# Patient Record
Sex: Female | Born: 1950 | Race: White | Hispanic: No | Smoking: Former smoker
Health system: Southern US, Community
[De-identification: ages and names within clinical notes are randomized; demographics above are authoritative.]

## PROBLEM LIST (undated history)

## (undated) DIAGNOSIS — T8859XA Other complications of anesthesia, initial encounter: Secondary | ICD-10-CM

## (undated) DIAGNOSIS — G709 Myoneural disorder, unspecified: Secondary | ICD-10-CM

## (undated) DIAGNOSIS — B029 Zoster without complications: Secondary | ICD-10-CM

## (undated) DIAGNOSIS — S0993XA Unspecified injury of face, initial encounter: Secondary | ICD-10-CM

## (undated) DIAGNOSIS — M722 Plantar fascial fibromatosis: Secondary | ICD-10-CM

## (undated) DIAGNOSIS — Z8781 Personal history of (healed) traumatic fracture: Secondary | ICD-10-CM

## (undated) DIAGNOSIS — T4145XA Adverse effect of unspecified anesthetic, initial encounter: Secondary | ICD-10-CM

## (undated) DIAGNOSIS — M1990 Unspecified osteoarthritis, unspecified site: Secondary | ICD-10-CM

## (undated) HISTORY — DX: Unspecified injury of face, initial encounter: S09.93XA

## (undated) HISTORY — DX: Personal history of (healed) traumatic fracture: Z87.81

## (undated) HISTORY — PX: FOOT SURGERY: SHX648

## (undated) HISTORY — DX: Plantar fascial fibromatosis: M72.2

## (undated) HISTORY — DX: Zoster without complications: B02.9

## (undated) HISTORY — PX: PERINEOPLASTY: SHX2218

---

## 1998-07-20 ENCOUNTER — Encounter: Admission: RE | Admit: 1998-07-20 | Discharge: 1998-07-20 | Payer: Self-pay | Admitting: Sports Medicine

## 2009-01-06 ENCOUNTER — Ambulatory Visit: Payer: Self-pay | Admitting: Sports Medicine

## 2009-01-06 DIAGNOSIS — M2140 Flat foot [pes planus] (acquired), unspecified foot: Secondary | ICD-10-CM | POA: Insufficient documentation

## 2009-01-06 DIAGNOSIS — M25569 Pain in unspecified knee: Secondary | ICD-10-CM

## 2009-02-09 ENCOUNTER — Ambulatory Visit: Payer: Self-pay | Admitting: Sports Medicine

## 2009-03-26 ENCOUNTER — Ambulatory Visit: Payer: Self-pay | Admitting: Sports Medicine

## 2009-03-26 DIAGNOSIS — M84376A Stress fracture, unspecified foot, initial encounter for fracture: Secondary | ICD-10-CM | POA: Insufficient documentation

## 2009-04-24 ENCOUNTER — Ambulatory Visit: Payer: Self-pay | Admitting: Family Medicine

## 2009-05-04 ENCOUNTER — Ambulatory Visit: Payer: Self-pay | Admitting: Family Medicine

## 2009-05-18 ENCOUNTER — Ambulatory Visit: Payer: Self-pay | Admitting: Family Medicine

## 2009-06-08 ENCOUNTER — Ambulatory Visit: Payer: Self-pay | Admitting: Family Medicine

## 2009-06-08 DIAGNOSIS — G5760 Lesion of plantar nerve, unspecified lower limb: Secondary | ICD-10-CM | POA: Insufficient documentation

## 2009-06-09 ENCOUNTER — Telehealth (INDEPENDENT_AMBULATORY_CARE_PROVIDER_SITE_OTHER): Payer: Self-pay | Admitting: *Deleted

## 2009-06-22 ENCOUNTER — Encounter: Payer: Self-pay | Admitting: Family Medicine

## 2010-06-11 ENCOUNTER — Ambulatory Visit: Payer: Self-pay | Admitting: Psychology

## 2010-06-30 ENCOUNTER — Ambulatory Visit
Admission: RE | Admit: 2010-06-30 | Discharge: 2010-06-30 | Payer: Self-pay | Source: Home / Self Care | Attending: Psychology | Admitting: Psychology

## 2010-07-14 ENCOUNTER — Ambulatory Visit
Admission: RE | Admit: 2010-07-14 | Discharge: 2010-07-14 | Payer: Self-pay | Source: Home / Self Care | Attending: Psychology | Admitting: Psychology

## 2012-01-08 ENCOUNTER — Ambulatory Visit (INDEPENDENT_AMBULATORY_CARE_PROVIDER_SITE_OTHER): Payer: BC Managed Care – PPO | Admitting: Emergency Medicine

## 2012-01-08 VITALS — BP 122/77 | HR 76 | Temp 97.6°F | Resp 16 | Ht 65.0 in | Wt 141.0 lb

## 2012-01-08 DIAGNOSIS — B029 Zoster without complications: Secondary | ICD-10-CM

## 2012-01-08 MED ORDER — VALACYCLOVIR HCL 1 G PO TABS: ORAL_TABLET | ORAL | Status: DC

## 2012-01-08 MED ORDER — GABAPENTIN 300 MG PO CAPS: ORAL_CAPSULE | ORAL | Status: DC

## 2012-01-08 NOTE — Patient Instructions (Signed)

## 2012-01-08 NOTE — Progress Notes (Signed)
  Subjective:    Patient ID: Felicia Peters, female    DOB: [birth year], 61 y.o.   MRN: [id number]  HPI patient here with 2 week history of intermittent pain in her right flank which radiates around to the right breast. Yesterday she noted a rash which developed on her back and now is underneath her right breast the    Review of Systems     Objective:   Physical Exam there is a typical shingles eruption in the distribution of T7 on the right. There is skin sensitivity in this area to        Assessment & Plan:  Patient with acute shingles. We'll treat with Valtrex 1 g 3 times a day Neurontin at night recheck in 3-4 days if pain is persistent.

## 2014-01-13 ENCOUNTER — Telehealth: Payer: Self-pay

## 2014-01-13 NOTE — Telephone Encounter (Signed)
Pt called and would like to have direct colon with Dr. Marina GoodellPerry. OK to schedule per Dr. Marina GoodellPerry. Screening colon.

## 2014-01-14 NOTE — Telephone Encounter (Signed)
Left message for Ms Felicia Peters to call me back & schedule Colonoscopy/yesi

## 2014-01-16 NOTE — Telephone Encounter (Signed)
Spoke with pt and she is checking on a ride for colon. Placed 02/10/14@3pm  on hold and she is going to call back and let me know if this will work for her.

## 2014-01-16 NOTE — Telephone Encounter (Signed)
Pt called back and would like to have colon done 01/27/14@3 :30pm. Previsit scheduled for 01/20/14@11am . Pt aware of appts.

## 2014-01-20 ENCOUNTER — Ambulatory Visit (AMBULATORY_SURGERY_CENTER): Payer: BC Managed Care – PPO | Admitting: *Deleted

## 2014-01-20 VITALS — Ht 66.0 in | Wt 136.2 lb

## 2014-01-20 DIAGNOSIS — Z1211 Encounter for screening for malignant neoplasm of colon: Secondary | ICD-10-CM

## 2014-01-20 MED ORDER — MOVIPREP 100 G PO SOLR: 1.0000 | Freq: Once | ORAL | Status: DC

## 2014-01-20 NOTE — Progress Notes (Signed)
Denies allergies to eggs or soy products. Denies complications with sedation or anesthesia. Denies O2 use. Denies use of diet or weight loss medications.  Emmi instructions given for colonoscopy.  

## 2014-01-27 ENCOUNTER — Ambulatory Visit (AMBULATORY_SURGERY_CENTER): Payer: BC Managed Care – PPO | Admitting: Internal Medicine

## 2014-01-27 ENCOUNTER — Encounter: Payer: Self-pay | Admitting: Internal Medicine

## 2014-01-27 VITALS — BP 106/67 | HR 66 | Temp 98.1°F | Resp 18 | Ht 66.0 in | Wt 136.0 lb

## 2014-01-27 DIAGNOSIS — Z1211 Encounter for screening for malignant neoplasm of colon: Secondary | ICD-10-CM

## 2014-01-27 MED ORDER — SODIUM CHLORIDE 0.9 % IV SOLN: 500.0000 mL | INTRAVENOUS | Status: DC

## 2014-01-27 NOTE — Op Note (Signed)
[facility] [address] [place], [id number]   COLONOSCOPY PROCEDURE REPORT  PATIENT: Felicia Peters, Felicia T.  MR#: [id number] BIRTHDATE: [birth year] , 63  yrs. old GENDER: Female ENDOSCOPIST: Roxy CedarJohn N Perry Jr, MD REFERRED BY:.Direct Self PROCEDURE DATE:  01/27/2014 PROCEDURE:   Colonoscopy, screening First Screening Colonoscopy - Avg.  risk and is 50 yrs.  old or older Yes.  Prior Negative Screening - Now for repeat screening. N/A  History of Adenoma - Now for follow-up colonoscopy & has been > or = to 3 yrs.  N/A  Polyps Removed Today? No.  Recommend repeat exam, <10 yrs? No. ASA CLASS:   Class I INDICATIONS:average risk screening. MEDICATIONS: MAC sedation, administered by CRNA and propofol (Diprivan) 400mg  IV  DESCRIPTION OF PROCEDURE:   After the risks benefits and alternatives of the procedure were thoroughly explained, informed consent was obtained.  A digital rectal exam revealed no abnormalities of the rectum.   The LB [id number]  endoscope was introduced through the anus and advanced to the cecum, which was identified by both the appendix and ileocecal valve. No adverse events experienced.   The quality of the prep was good, using MoviPrep  The instrument was then slowly withdrawn as the colon was fully examined.   COLON FINDINGS: A normal appearing cecum, ileocecal valve, and appendiceal orifice were identified.  The ascending, hepatic flexure, transverse, splenic flexure, descending, sigmoid colon and rectum appeared unremarkable.  No polyps or cancers were seen. Retroflexed views revealed internal hemorrhoids. The time to cecum=4 minutes 07 seconds.  Withdrawal time=12 minutes 40 seconds. The scope was withdrawn and the procedure completed.  COMPLICATIONS: There were no complications.  ENDOSCOPIC IMPRESSION: 1.Normal colon  RECOMMENDATIONS: 1.Continue current colorectal screening recommendations for "routine risk" patients with a repeat  colonoscopy in 10 years.   eSigned:  Roxy CedarJohn N Perry Jr, MD 01/27/2014 4:01 PM   cc: The Patient and Lesle ChrisSteven Daub, MD

## 2014-01-27 NOTE — Patient Instructions (Signed)
YOU HAD AN ENDOSCOPIC PROCEDURE TODAY AT THE [facility]: Refer to the procedure report that was given to you for any specific questions about what was found during the examination.  If the procedure report does not answer your questions, please call your gastroenterologist to clarify.  If you requested that your care partner not be given the details of your procedure findings, then the procedure report has been included in a sealed envelope for you to review at your convenience later.  YOU SHOULD EXPECT: Some feelings of bloating in the abdomen. Passage of more gas than usual.  Walking can help get rid of the air that was put into your GI tract during the procedure and reduce the bloating. If you had a lower endoscopy (such as a colonoscopy or flexible sigmoidoscopy) you may notice spotting of blood in your stool or on the toilet paper. If you underwent a bowel prep for your procedure, then you may not have a normal bowel movement for a few days.  DIET: Your first meal following the procedure should be a light meal and then it is ok to progress to your normal diet.  A half-sandwich or bowl of soup is an example of a good first meal.  Heavy or fried foods are harder to digest and may make you feel nauseous or bloated.  Likewise meals heavy in dairy and vegetables can cause extra gas to form and this can also increase the bloating.  Drink plenty of fluids but you should avoid alcoholic beverages for 24 hours.  ACTIVITY: Your care partner should take you home directly after the procedure.  You should plan to take it easy, moving slowly for the rest of the day.  You can resume normal activity the day after the procedure however you should NOT DRIVE or use heavy machinery for 24 hours (because of the sedation medicines used during the test).    SYMPTOMS TO REPORT IMMEDIATELY: A gastroenterologist can be reached at any hour.  During normal business hours, 8:30 AM to 5:00 PM Monday through Friday,  call [phone].  After hours and on weekends, please call the GI answering service at [phone] who will take a message and have the physician on call contact you.   Following lower endoscopy (colonoscopy or flexible sigmoidoscopy):  Excessive amounts of blood in the stool  Significant tenderness or worsening of abdominal pains  Swelling of the abdomen that is new, acute  Fever of 100F or higher    FOLLOW UP: If any biopsies were taken you will be contacted by phone or by letter within the next 1-3 weeks.  Call your gastroenterologist if you have not heard about the biopsies in 3 weeks.  Our staff will call the home number listed on your records the next business day following your procedure to check on you and address any questions or concerns that you may have at that time regarding the information given to you following your procedure. This is a courtesy call and so if there is no answer at the home number and we have not heard from you through the emergency physician on call, we will assume that you have returned to your regular daily activities without incident.  SIGNATURES/CONFIDENTIALITY: You and/or your care partner have signed paperwork which will be entered into your electronic medical record.  These signatures attest to the fact that that the information above on your After Visit Summary has been reviewed and is understood.  Full responsibility of the confidentiality   of this discharge information lies with you and/or your care-partner.  Normal colonoscopy-Repeat in 10 years-2025. 

## 2014-01-27 NOTE — Progress Notes (Signed)
A/ox3, pleased with MAC, report to RN 

## 2014-01-28 ENCOUNTER — Telehealth: Payer: Self-pay | Admitting: *Deleted

## 2014-01-28 NOTE — Telephone Encounter (Signed)
  Follow up Call-  Call back number 01/27/2014  Post procedure Call Back phone  # [phone]  Permission to leave phone message Yes     Patient questions:  Do you have a fever, pain , or abdominal swelling? No. Pain Score  0 *  Have you tolerated food without any problems? Yes.    Have you been able to return to your normal activities? Yes.    Do you have any questions about your discharge instructions: Diet   No. Medications  No. Follow up visit  No.  Do you have questions or concerns about your Care? No.  Actions: * If pain score is 4 or above: No action needed, pain <4.

## 2015-03-31 ENCOUNTER — Encounter: Payer: Self-pay | Admitting: Emergency Medicine

## 2016-07-11 ENCOUNTER — Other Ambulatory Visit: Payer: Self-pay | Admitting: Sports Medicine

## 2016-07-11 DIAGNOSIS — M545 Low back pain: Secondary | ICD-10-CM

## 2016-07-16 ENCOUNTER — Ambulatory Visit
Admission: RE | Admit: 2016-07-16 | Discharge: 2016-07-16 | Disposition: A | Discharge status: Home / Self Care | Payer: BC Managed Care – PPO | Source: Ambulatory Visit | Attending: Sports Medicine | Admitting: Sports Medicine

## 2016-07-16 DIAGNOSIS — M545 Low back pain: Secondary | ICD-10-CM

## 2016-07-19 ENCOUNTER — Other Ambulatory Visit: Payer: Self-pay | Admitting: Sports Medicine

## 2016-07-19 DIAGNOSIS — M129 Arthropathy, unspecified: Secondary | ICD-10-CM

## 2016-07-22 ENCOUNTER — Ambulatory Visit
Admission: RE | Admit: 2016-07-22 | Discharge: 2016-07-22 | Disposition: A | Discharge status: Home / Self Care | Payer: BC Managed Care – PPO | Source: Ambulatory Visit | Attending: Sports Medicine | Admitting: Sports Medicine

## 2016-07-22 ENCOUNTER — Other Ambulatory Visit: Payer: Self-pay | Admitting: Sports Medicine

## 2016-07-22 DIAGNOSIS — M129 Arthropathy, unspecified: Secondary | ICD-10-CM

## 2016-07-22 DIAGNOSIS — M47816 Spondylosis without myelopathy or radiculopathy, lumbar region: Secondary | ICD-10-CM

## 2016-07-22 MED ORDER — METHYLPREDNISOLONE ACETATE 40 MG/ML INJ SUSP (RADIOLOG
120.0000 mg | Freq: Once | INTRAMUSCULAR | Status: AC
  Administered 2016-07-22: 10 mg via EPIDURAL

## 2016-07-22 MED ORDER — IOPAMIDOL (ISOVUE-M 200) INJECTION 41%
1.0000 mL | Freq: Once | INTRAMUSCULAR | Status: AC
  Administered 2016-07-22: 1 mL via EPIDURAL

## 2016-07-22 NOTE — Discharge Instructions (Signed)

## 2016-07-28 ENCOUNTER — Inpatient Hospital Stay: Admission: RE | Admit: 2016-07-28 | Payer: BC Managed Care – PPO | Source: Ambulatory Visit

## 2016-08-31 ENCOUNTER — Other Ambulatory Visit: Payer: Self-pay | Admitting: Neurosurgery

## 2016-12-01 ENCOUNTER — Encounter (HOSPITAL_COMMUNITY): Payer: Self-pay

## 2016-12-01 ENCOUNTER — Encounter (HOSPITAL_COMMUNITY)
Admission: RE | Admit: 2016-12-01 | Discharge: 2016-12-01 | Disposition: A | Discharge status: Home / Self Care | Payer: BC Managed Care – PPO | Source: Ambulatory Visit | Attending: Neurosurgery | Admitting: Neurosurgery

## 2016-12-01 DIAGNOSIS — Z01812 Encounter for preprocedural laboratory examination: Secondary | ICD-10-CM | POA: Diagnosis not present

## 2016-12-01 DIAGNOSIS — M48062 Spinal stenosis, lumbar region with neurogenic claudication: Secondary | ICD-10-CM | POA: Diagnosis not present

## 2016-12-01 HISTORY — DX: Other complications of anesthesia, initial encounter: T88.59XA

## 2016-12-01 HISTORY — DX: Adverse effect of unspecified anesthetic, initial encounter: T41.45XA

## 2016-12-01 HISTORY — DX: Myoneural disorder, unspecified: G70.9

## 2016-12-01 HISTORY — DX: Unspecified osteoarthritis, unspecified site: M19.90

## 2016-12-01 LAB — BASIC METABOLIC PANEL
Anion gap: 8 (ref 5–15)
BUN: 15 mg/dL (ref 6–20)
CO2: 25 mmol/L (ref 22–32)
Calcium: 9.2 mg/dL (ref 8.9–10.3)
Chloride: 105 mmol/L (ref 101–111)
Creatinine, Ser: 0.78 mg/dL (ref 0.44–1.00)
GFR calc Af Amer: >60 mL/min (ref >60)
GFR calc non Af Amer: >60 mL/min (ref >60)
Glucose, Bld: 96 mg/dL (ref 65–99)
Potassium: 4.3 mmol/L (ref 3.5–5.1)
Sodium: 138 mmol/L (ref 135–145)

## 2016-12-01 LAB — TYPE AND SCREEN
ABO/RH(D): O POS
Antibody Screen: NEGATIVE

## 2016-12-01 LAB — SURGICAL PCR SCREEN
MRSA, PCR: NEGATIVE
Staphylococcus aureus: NEGATIVE

## 2016-12-01 LAB — CBC
HCT: 43.2 % (ref 36.0–46.0)
Hemoglobin: 14.1 g/dL (ref 12.0–15.0)
MCH: 30.7 pg (ref 26.0–34.0)
MCHC: 32.6 g/dL (ref 30.0–36.0)
MCV: 93.9 fL (ref 78.0–100.0)
Platelets: 252 10*3/uL (ref 150–400)
RBC: 4.6 MIL/uL (ref 3.87–5.11)
RDW: 13 % (ref 11.5–15.5)
WBC: 6.5 10*3/uL (ref 4.0–10.5)

## 2016-12-01 LAB — ABO/RH: ABO/RH(D): O POS

## 2016-12-01 NOTE — Progress Notes (Signed)
Pt. Denies any cardiac issues, denies problems in the past or currently. Pt. Denies ever having any cardiac testing.  Pt. Sees GYN - Dr. Rana SnareLowe every yr. But for a PCP- uses Urgent care & has mostly seen Dr. Kathie RhodesS. Cleta Albertsaub.

## 2016-12-01 NOTE — Pre-Procedure Instructions (Signed)
Freddrick Marcholly T Cicero  12/01/2016      [facility] [place] - [address] [place] [id number] Phone: [phone] Fax: [phone]    Your procedure is scheduled on 12/07/2016  [facility] Admitting at 6:30 A.M.  Call this number if you have problems the morning of surgery:  [phone]   Stop all supplements now- vitamins, ibuprofen, ADVIL   Remember:  Do not eat food or drink liquids after midnight. ON Tuesday night    Take these medicines the morning of surgery with A SIP OF WATER : with hormone pill & tylenol or Tramadol if needed    Do not wear jewelry, make-up or nail polish.   Do not wear lotions, powders, or perfumes, or deoderant.   Do not shave 48 hours prior to surgery.     Do not bring valuables to the hospital.   [facility] is not responsible for any belongings or valuables.  Contacts, dentures or bridgework may not be worn into surgery.  Leave your suitcase in the car.  After surgery it may be brought to your room.  For patients admitted to the hospital, discharge time will be determined by your treatment team.  Patients discharged the day of surgery will not be allowed to drive home.   Name and phone number of your driver:   With family  Special instructions:  Special Instructions: [place] - Preparing for Surgery  Before surgery, you can play an important role.  Because skin is not sterile, your skin needs to be as free of germs as possible.  You can reduce the number of germs on you skin by washing with CHG (chlorahexidine gluconate) soap before surgery.  CHG is an antiseptic cleaner which kills germs and bonds with the skin to continue killing germs even after washing.  Please DO NOT use if you have an allergy to CHG or antibacterial soaps.  If your skin becomes reddened/irritated stop using the CHG and inform your nurse when you arrive at Short Stay.  Do not shave (including legs and underarms) for  at least 48 hours prior to the first CHG shower.  You may shave your face.  Please follow these instructions carefully:   1.  Shower with CHG Soap the night before surgery and the  morning of Surgery.  2.  If you choose to wash your hair, wash your hair first as usual with your  normal shampoo.  3.  After you shampoo, rinse your hair and body thoroughly to remove the  Shampoo.  4.  Use CHG as you would any other liquid soap.  You can apply chg directly to the skin and wash gently with scrungie or a clean washcloth.  5.  Apply the CHG Soap to your body ONLY FROM THE NECK DOWN.    Do not use on open wounds or open sores.  Avoid contact with your eyes, ears, mouth and genitals (private parts).  Wash genitals (private parts)   with your normal soap.  6.  Wash thoroughly, paying special attention to the area where your surgery will be performed.  7.  Thoroughly rinse your body with warm water from the neck down.  8.  DO NOT shower/wash with your normal soap after using and rinsing off   the CHG Soap.  9.  Pat yourself dry with a clean towel.            10.  Wear  clean pajamas.            11.  Place clean sheets on your bed the night of your first shower and do not sleep with pets.  Day of Surgery  Do not apply any lotions/deodorants the morning of surgery.  Please wear clean clothes to the hospital/surgery center.  Please read over the following fact sheets that you were given. Pain Booklet, Coughing and Deep Breathing, MRSA Information and Surgical Site Infection Prevention

## 2016-12-06 MED ORDER — CEFAZOLIN SODIUM-DEXTROSE 2-4 GM/100ML-% IV SOLN
2.0000 g | INTRAVENOUS | Status: AC
  Administered 2016-12-07: 2 g via INTRAVENOUS
  Administered 2016-12-07: 1 g via INTRAVENOUS
  Filled 2016-12-06: qty 100

## 2016-12-07 ENCOUNTER — Inpatient Hospital Stay (HOSPITAL_COMMUNITY): Payer: BC Managed Care – PPO

## 2016-12-07 ENCOUNTER — Encounter (HOSPITAL_COMMUNITY)
Admission: RE | Disposition: A | Discharge status: Home / Self Care | Payer: Self-pay | Source: Ambulatory Visit | Attending: Neurosurgery

## 2016-12-07 ENCOUNTER — Inpatient Hospital Stay (HOSPITAL_COMMUNITY): Payer: BC Managed Care – PPO | Admitting: Certified Registered"

## 2016-12-07 ENCOUNTER — Observation Stay (HOSPITAL_COMMUNITY)
Admission: RE | Admit: 2016-12-07 | Discharge: 2016-12-08 | DRG: 455 | Disposition: A | Discharge status: Home / Self Care | Payer: BC Managed Care – PPO | Source: Ambulatory Visit | Attending: Neurosurgery | Admitting: Neurosurgery

## 2016-12-07 ENCOUNTER — Encounter (HOSPITAL_COMMUNITY): Payer: Self-pay

## 2016-12-07 DIAGNOSIS — M4316 Spondylolisthesis, lumbar region: Secondary | ICD-10-CM | POA: Diagnosis not present

## 2016-12-07 DIAGNOSIS — Z885 Allergy status to narcotic agent status: Secondary | ICD-10-CM | POA: Insufficient documentation

## 2016-12-07 DIAGNOSIS — Z87891 Personal history of nicotine dependence: Secondary | ICD-10-CM | POA: Diagnosis not present

## 2016-12-07 DIAGNOSIS — Z91013 Allergy to seafood: Secondary | ICD-10-CM | POA: Diagnosis not present

## 2016-12-07 DIAGNOSIS — M1990 Unspecified osteoarthritis, unspecified site: Secondary | ICD-10-CM | POA: Insufficient documentation

## 2016-12-07 DIAGNOSIS — M48061 Spinal stenosis, lumbar region without neurogenic claudication: Secondary | ICD-10-CM | POA: Diagnosis present

## 2016-12-07 DIAGNOSIS — Z888 Allergy status to other drugs, medicaments and biological substances status: Secondary | ICD-10-CM | POA: Diagnosis not present

## 2016-12-07 DIAGNOSIS — Z7982 Long term (current) use of aspirin: Secondary | ICD-10-CM | POA: Insufficient documentation

## 2016-12-07 DIAGNOSIS — M5116 Intervertebral disc disorders with radiculopathy, lumbar region: Secondary | ICD-10-CM | POA: Insufficient documentation

## 2016-12-07 DIAGNOSIS — Z419 Encounter for procedure for purposes other than remedying health state, unspecified: Secondary | ICD-10-CM

## 2016-12-07 DIAGNOSIS — G5761 Lesion of plantar nerve, right lower limb: Secondary | ICD-10-CM | POA: Diagnosis not present

## 2016-12-07 DIAGNOSIS — M48062 Spinal stenosis, lumbar region with neurogenic claudication: Principal | ICD-10-CM | POA: Insufficient documentation

## 2016-12-07 DIAGNOSIS — Z79899 Other long term (current) drug therapy: Secondary | ICD-10-CM | POA: Insufficient documentation

## 2016-12-07 DIAGNOSIS — M4726 Other spondylosis with radiculopathy, lumbar region: Secondary | ICD-10-CM | POA: Diagnosis not present

## 2016-12-07 SURGERY — POSTERIOR LUMBAR FUSION 1 LEVEL
Anesthesia: General | Site: Back

## 2016-12-07 MED ORDER — FLEET ENEMA 7-19 GM/118ML RE ENEM: 1.0000 | ENEMA | Freq: Once | RECTAL | Status: DC | PRN

## 2016-12-07 MED ORDER — SUFENTANIL CITRATE 50 MCG/ML IV SOLN
INTRAVENOUS | Status: DC | PRN
  Administered 2016-12-07: 20 ug via INTRAVENOUS
  Administered 2016-12-07 (×2): 10 ug via INTRAVENOUS

## 2016-12-07 MED ORDER — DEXAMETHASONE SODIUM PHOSPHATE 10 MG/ML IJ SOLN
INTRAMUSCULAR | Status: DC | PRN
  Administered 2016-12-07: 4 mg via INTRAVENOUS

## 2016-12-07 MED ORDER — LIDOCAINE-EPINEPHRINE 1 %-1:100000 IJ SOLN
INTRAMUSCULAR | Status: AC
Start: 2016-12-07 — End: 2016-12-07
  Filled 2016-12-07: qty 1

## 2016-12-07 MED ORDER — LACTATED RINGERS IV SOLN
INTRAVENOUS | Status: DC | PRN
  Administered 2016-12-07 (×2): via INTRAVENOUS

## 2016-12-07 MED ORDER — MORPHINE SULFATE (PF) 4 MG/ML IV SOLN: 4.0000 mg | INTRAVENOUS | Status: DC | PRN

## 2016-12-07 MED ORDER — MIDAZOLAM HCL 5 MG/5ML IJ SOLN
INTRAMUSCULAR | Status: DC | PRN
  Administered 2016-12-07: 2 mg via INTRAVENOUS

## 2016-12-07 MED ORDER — ACETAMINOPHEN 10 MG/ML IV SOLN
INTRAVENOUS | Status: AC
  Filled 2016-12-07: qty 100

## 2016-12-07 MED ORDER — ROCURONIUM BROMIDE 10 MG/ML (PF) SYRINGE
PREFILLED_SYRINGE | INTRAVENOUS | Status: AC
  Filled 2016-12-07: qty 5

## 2016-12-07 MED ORDER — ACETAMINOPHEN 650 MG RE SUPP: 650.0000 mg | RECTAL | Status: DC | PRN

## 2016-12-07 MED ORDER — BUPIVACAINE HCL (PF) 0.5 % IJ SOLN
INTRAMUSCULAR | Status: DC | PRN
  Administered 2016-12-07: 15 mL

## 2016-12-07 MED ORDER — LIDOCAINE 2% (20 MG/ML) 5 ML SYRINGE
INTRAMUSCULAR | Status: AC
  Filled 2016-12-07: qty 5

## 2016-12-07 MED ORDER — THROMBIN 5000 UNITS EX SOLR
OROMUCOSAL | Status: DC | PRN
  Administered 2016-12-07: 10:00:00 via TOPICAL

## 2016-12-07 MED ORDER — DIPHENHYDRAMINE HCL 25 MG PO CAPS: 25.0000 mg | ORAL_CAPSULE | Freq: Four times a day (QID) | ORAL | Status: DC | PRN

## 2016-12-07 MED ORDER — 0.9 % SODIUM CHLORIDE (POUR BTL) OPTIME
TOPICAL | Status: DC | PRN
  Administered 2016-12-07: 1000 mL

## 2016-12-07 MED ORDER — MENTHOL 3 MG MT LOZG: 1.0000 | LOZENGE | OROMUCOSAL | Status: DC | PRN

## 2016-12-07 MED ORDER — SODIUM CHLORIDE 0.9 % IR SOLN
Status: DC | PRN
  Administered 2016-12-07: 10:00:00

## 2016-12-07 MED ORDER — SODIUM CHLORIDE 0.9 % IV SOLN: 250.0000 mL | INTRAVENOUS | Status: DC

## 2016-12-07 MED ORDER — ACETAMINOPHEN 325 MG PO TABS
650.0000 mg | ORAL_TABLET | ORAL | Status: DC | PRN
  Administered 2016-12-07: 650 mg via ORAL
  Filled 2016-12-07: qty 2

## 2016-12-07 MED ORDER — KETOROLAC TROMETHAMINE 30 MG/ML IJ SOLN: 15.0000 mg | Freq: Once | INTRAMUSCULAR | Status: AC

## 2016-12-07 MED ORDER — THROMBIN 20000 UNITS EX SOLR
CUTANEOUS | Status: AC
  Filled 2016-12-07: qty 20000

## 2016-12-07 MED ORDER — ALUM & MAG HYDROXIDE-SIMETH 200-200-20 MG/5ML PO SUSP
30.0000 mL | Freq: Four times a day (QID) | ORAL | Status: DC | PRN
  Filled 2016-12-07: qty 30

## 2016-12-07 MED ORDER — HYDROCODONE-ACETAMINOPHEN 5-325 MG PO TABS: 1.0000 | ORAL_TABLET | ORAL | Status: DC | PRN

## 2016-12-07 MED ORDER — PROPOFOL 10 MG/ML IV BOLUS
INTRAVENOUS | Status: DC | PRN
  Administered 2016-12-07: 160 mg via INTRAVENOUS

## 2016-12-07 MED ORDER — ONDANSETRON HCL 4 MG PO TABS: 4.0000 mg | ORAL_TABLET | Freq: Four times a day (QID) | ORAL | Status: DC | PRN

## 2016-12-07 MED ORDER — CHLORHEXIDINE GLUCONATE CLOTH 2 % EX PADS: 6.0000 | MEDICATED_PAD | Freq: Once | CUTANEOUS | Status: DC

## 2016-12-07 MED ORDER — PHENYLEPHRINE 40 MCG/ML (10ML) SYRINGE FOR IV PUSH (FOR BLOOD PRESSURE SUPPORT)
PREFILLED_SYRINGE | INTRAVENOUS | Status: DC | PRN
  Administered 2016-12-07: 80 ug via INTRAVENOUS
  Administered 2016-12-07: 120 ug via INTRAVENOUS

## 2016-12-07 MED ORDER — KETOROLAC TROMETHAMINE 15 MG/ML IJ SOLN
INTRAMUSCULAR | Status: AC
  Administered 2016-12-07: 15 mg
  Filled 2016-12-07: qty 1

## 2016-12-07 MED ORDER — ONDANSETRON HCL 4 MG/2ML IJ SOLN: 4.0000 mg | Freq: Four times a day (QID) | INTRAMUSCULAR | Status: DC | PRN

## 2016-12-07 MED ORDER — SODIUM CHLORIDE 0.9 % IJ SOLN
INTRAMUSCULAR | Status: AC
  Filled 2016-12-07: qty 10

## 2016-12-07 MED ORDER — DEXAMETHASONE SODIUM PHOSPHATE 10 MG/ML IJ SOLN
INTRAMUSCULAR | Status: AC
  Filled 2016-12-07: qty 1

## 2016-12-07 MED ORDER — FENTANYL CITRATE (PF) 100 MCG/2ML IJ SOLN: 25.0000 ug | INTRAMUSCULAR | Status: DC | PRN

## 2016-12-07 MED ORDER — KETOROLAC TROMETHAMINE 30 MG/ML IJ SOLN
15.0000 mg | Freq: Four times a day (QID) | INTRAMUSCULAR | Status: DC
  Administered 2016-12-07 – 2016-12-08 (×3): 15 mg via INTRAVENOUS
  Filled 2016-12-07 (×3): qty 1

## 2016-12-07 MED ORDER — LIDOCAINE 2% (20 MG/ML) 5 ML SYRINGE
INTRAMUSCULAR | Status: DC | PRN
  Administered 2016-12-07: 60 mg via INTRAVENOUS

## 2016-12-07 MED ORDER — PHENOL 1.4 % MT LIQD: 1.0000 | OROMUCOSAL | Status: DC | PRN

## 2016-12-07 MED ORDER — ACETAMINOPHEN 500 MG PO TABS
1000.0000 mg | ORAL_TABLET | Freq: Four times a day (QID) | ORAL | Status: DC | PRN
Start: 2016-12-07 — End: 2016-12-08
  Administered 2016-12-08: 1000 mg via ORAL
  Filled 2016-12-07: qty 2

## 2016-12-07 MED ORDER — ONDANSETRON HCL 4 MG/2ML IJ SOLN: 4.0000 mg | Freq: Once | INTRAMUSCULAR | Status: DC | PRN

## 2016-12-07 MED ORDER — THROMBIN 20000 UNITS EX SOLR
CUTANEOUS | Status: DC | PRN
  Administered 2016-12-07: 10:00:00 via TOPICAL

## 2016-12-07 MED ORDER — KCL IN DEXTROSE-NACL 20-5-0.45 MEQ/L-%-% IV SOLN: INTRAVENOUS | Status: DC

## 2016-12-07 MED ORDER — ACETAMINOPHEN 10 MG/ML IV SOLN
INTRAVENOUS | Status: DC | PRN
  Administered 2016-12-07: 1000 mg via INTRAVENOUS

## 2016-12-07 MED ORDER — BISACODYL 10 MG RE SUPP: 10.0000 mg | Freq: Every day | RECTAL | Status: DC | PRN

## 2016-12-07 MED ORDER — HYDROMORPHONE HCL 1 MG/ML IJ SOLN: 0.2500 mg | INTRAMUSCULAR | Status: DC | PRN

## 2016-12-07 MED ORDER — HYDROXYZINE HCL 25 MG PO TABS: 50.0000 mg | ORAL_TABLET | ORAL | Status: DC | PRN

## 2016-12-07 MED ORDER — SODIUM CHLORIDE 0.9% FLUSH: 3.0000 mL | Freq: Two times a day (BID) | INTRAVENOUS | Status: DC

## 2016-12-07 MED ORDER — SODIUM CHLORIDE 0.9% FLUSH: 3.0000 mL | INTRAVENOUS | Status: DC | PRN

## 2016-12-07 MED ORDER — LIDOCAINE-EPINEPHRINE 1 %-1:100000 IJ SOLN
INTRAMUSCULAR | Status: DC | PRN
  Administered 2016-12-07: 15 mL

## 2016-12-07 MED ORDER — PROPOFOL 10 MG/ML IV BOLUS
INTRAVENOUS | Status: AC
  Filled 2016-12-07: qty 20

## 2016-12-07 MED ORDER — ONDANSETRON HCL 4 MG/2ML IJ SOLN
INTRAMUSCULAR | Status: DC | PRN
  Administered 2016-12-07: 4 mg via INTRAVENOUS

## 2016-12-07 MED ORDER — PHENYLEPHRINE HCL 10 MG/ML IJ SOLN
INTRAVENOUS | Status: DC | PRN
  Administered 2016-12-07: 40 ug/min via INTRAVENOUS

## 2016-12-07 MED ORDER — MAGNESIUM HYDROXIDE 400 MG/5ML PO SUSP: 30.0000 mL | Freq: Every day | ORAL | Status: DC | PRN

## 2016-12-07 MED ORDER — HYDROXYZINE HCL 50 MG/ML IM SOLN: 50.0000 mg | INTRAMUSCULAR | Status: DC | PRN

## 2016-12-07 MED ORDER — CYCLOBENZAPRINE HCL 5 MG PO TABS: 5.0000 mg | ORAL_TABLET | Freq: Three times a day (TID) | ORAL | Status: DC | PRN

## 2016-12-07 MED ORDER — MIDAZOLAM HCL 2 MG/2ML IJ SOLN
INTRAMUSCULAR | Status: AC
  Filled 2016-12-07: qty 2

## 2016-12-07 MED ORDER — SUFENTANIL CITRATE 50 MCG/ML IV SOLN
INTRAVENOUS | Status: AC
  Filled 2016-12-07: qty 1

## 2016-12-07 MED ORDER — BUPIVACAINE HCL (PF) 0.5 % IJ SOLN
INTRAMUSCULAR | Status: AC
  Filled 2016-12-07: qty 30

## 2016-12-07 MED ORDER — ONDANSETRON HCL 4 MG/2ML IJ SOLN
INTRAMUSCULAR | Status: AC
  Filled 2016-12-07: qty 2

## 2016-12-07 MED ORDER — THROMBIN 5000 UNITS EX SOLR
CUTANEOUS | Status: AC
  Filled 2016-12-07: qty 5000

## 2016-12-07 MED ORDER — ROCURONIUM BROMIDE 10 MG/ML (PF) SYRINGE
PREFILLED_SYRINGE | INTRAVENOUS | Status: DC | PRN
Start: 2016-12-07 — End: 2016-12-07
  Administered 2016-12-07: 10 mg via INTRAVENOUS
  Administered 2016-12-07: 50 mg via INTRAVENOUS

## 2016-12-07 MED FILL — Heparin Sodium (Porcine) Inj 1000 Unit/ML: INTRAMUSCULAR | Qty: 30 | Status: AC

## 2016-12-07 MED FILL — Sodium Chloride IV Soln 0.9%: INTRAVENOUS | Qty: 1000 | Status: AC

## 2016-12-07 SURGICAL SUPPLY — 69 items
ADH SKN CLS APL DERMABOND .7 (GAUZE/BANDAGES/DRESSINGS) ×1
BAG DECANTER FOR FLEXI CONT (MISCELLANEOUS) ×2 IMPLANT
BLADE CLIPPER SURG (BLADE) IMPLANT
BUR ACRON 5.0MM COATED (BURR) ×3 IMPLANT
BUR MATCHSTICK NEURO 3.0 LAGG (BURR) ×2 IMPLANT
CANISTER SUCT 3000ML PPV (MISCELLANEOUS) ×2 IMPLANT
CAP LCK SPNE (Orthopedic Implant) ×4 IMPLANT
CAP LOCK SPINE RADIUS (Orthopedic Implant) IMPLANT
CAP LOCKING (Orthopedic Implant) ×8 IMPLANT
CARTRIDGE OIL MAESTRO DRILL (MISCELLANEOUS) ×1 IMPLANT
CONT SPEC 4OZ CLIKSEAL STRL BL (MISCELLANEOUS) ×2 IMPLANT
COVER BACK TABLE 60X90IN (DRAPES) ×2 IMPLANT
DERMABOND ADVANCED (GAUZE/BANDAGES/DRESSINGS) ×1
DERMABOND ADVANCED .7 DNX12 (GAUZE/BANDAGES/DRESSINGS) ×2 IMPLANT
DIFFUSER DRILL AIR PNEUMATIC (MISCELLANEOUS) ×2 IMPLANT
DRAPE C-ARM 42X72 X-RAY (DRAPES) ×4 IMPLANT
DRAPE C-ARMOR (DRAPES) IMPLANT
DRAPE HALF SHEET 40X57 (DRAPES) IMPLANT
DRAPE LAPAROTOMY 100X72X124 (DRAPES) ×2 IMPLANT
DRAPE POUCH INSTRU U-SHP 10X18 (DRAPES) ×2 IMPLANT
ELECT REM PT RETURN 9FT ADLT (ELECTROSURGICAL) ×2
ELECTRODE REM PT RTRN 9FT ADLT (ELECTROSURGICAL) ×1 IMPLANT
GAUZE SPONGE 4X4 12PLY STRL (GAUZE/BANDAGES/DRESSINGS) ×2 IMPLANT
GAUZE SPONGE 4X4 12PLY STRL LF (GAUZE/BANDAGES/DRESSINGS) ×1 IMPLANT
GAUZE SPONGE 4X4 16PLY XRAY LF (GAUZE/BANDAGES/DRESSINGS) IMPLANT
GLOVE BIOGEL PI IND STRL 7.5 (GLOVE) IMPLANT
GLOVE BIOGEL PI IND STRL 8 (GLOVE) ×2 IMPLANT
GLOVE BIOGEL PI INDICATOR 7.5 (GLOVE) ×1
GLOVE BIOGEL PI INDICATOR 8 (GLOVE) ×9
GLOVE ECLIPSE 7.5 STRL STRAW (GLOVE) ×5 IMPLANT
GOWN STRL REUS W/ TWL LRG LVL3 (GOWN DISPOSABLE) ×1 IMPLANT
GOWN STRL REUS W/ TWL XL LVL3 (GOWN DISPOSABLE) ×1 IMPLANT
GOWN STRL REUS W/TWL 2XL LVL3 (GOWN DISPOSABLE) ×2 IMPLANT
GOWN STRL REUS W/TWL LRG LVL3 (GOWN DISPOSABLE) ×2
GOWN STRL REUS W/TWL XL LVL3 (GOWN DISPOSABLE) ×4
KIT BASIN OR (CUSTOM PROCEDURE TRAY) ×2 IMPLANT
KIT INFUSE X SMALL 1.4CC (Orthopedic Implant) ×1 IMPLANT
KIT ROOM TURNOVER OR (KITS) ×2 IMPLANT
NDL ASP BONE MRW 8GX15 (NEEDLE) IMPLANT
NDL SPNL 18GX3.5 QUINCKE PK (NEEDLE) IMPLANT
NDL SPNL 22GX3.5 QUINCKE BK (NEEDLE) ×2 IMPLANT
NEEDLE ASP BONE MRW 8GX15 (NEEDLE) ×2 IMPLANT
NEEDLE SPNL 18GX3.5 QUINCKE PK (NEEDLE) ×2 IMPLANT
NEEDLE SPNL 22GX3.5 QUINCKE BK (NEEDLE) ×4 IMPLANT
NS IRRIG 1000ML POUR BTL (IV SOLUTION) ×2 IMPLANT
OIL CARTRIDGE MAESTRO DRILL (MISCELLANEOUS) ×2
PACK LAMINECTOMY NEURO (CUSTOM PROCEDURE TRAY) ×2 IMPLANT
PAD ARMBOARD 7.5X6 YLW CONV (MISCELLANEOUS) ×6 IMPLANT
PATTIES SURGICAL .5 X.5 (GAUZE/BANDAGES/DRESSINGS) IMPLANT
PATTIES SURGICAL .5 X1 (DISPOSABLE) ×1 IMPLANT
PATTIES SURGICAL 1X1 (DISPOSABLE) IMPLANT
PEEK PLIF AVS 9X20X4 (Peek) ×2 IMPLANT
ROD 5.5X30MM (Rod) ×2 IMPLANT
SCREW 5.75X45MM (Screw) ×4 IMPLANT
SPONGE LAP 4X18 X RAY DECT (DISPOSABLE) IMPLANT
SPONGE NEURO XRAY DETECT 1X3 (DISPOSABLE) IMPLANT
SPONGE SURGIFOAM ABS GEL 100 (HEMOSTASIS) ×2 IMPLANT
STRIP BIOACTIVE VITOSS 25X100X (Neuro Prosthesis/Implant) ×1 IMPLANT
STRIP BIOACTIVE VITOSS 25X52X4 (Orthopedic Implant) ×1 IMPLANT
SUT VIC AB 1 CT1 18XBRD ANBCTR (SUTURE) ×2 IMPLANT
SUT VIC AB 1 CT1 8-18 (SUTURE) ×4
SUT VIC AB 2-0 CP2 18 (SUTURE) ×4 IMPLANT
SYR 3ML LL SCALE MARK (SYRINGE) IMPLANT
SYR CONTROL 10ML LL (SYRINGE) ×2 IMPLANT
TAPE CLOTH SURG 4X10 WHT LF (GAUZE/BANDAGES/DRESSINGS) ×1 IMPLANT
TOWEL GREEN STERILE (TOWEL DISPOSABLE) ×2 IMPLANT
TOWEL GREEN STERILE FF (TOWEL DISPOSABLE) ×2 IMPLANT
TRAY FOLEY W/METER SILVER 16FR (SET/KITS/TRAYS/PACK) ×2 IMPLANT
WATER STERILE IRR 1000ML POUR (IV SOLUTION) ×2 IMPLANT

## 2016-12-07 NOTE — Op Note (Signed)
12/07/2016  12:17 PM  PATIENT:  Felicia Peters  66 y.o. 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, lumbar spondylosis, lumbar degenerative disease, lumbar radiculopathy   PROCEDURE:  Procedure(s):  Bilateral L4-5 lumbar decompression including laminectomy, facetectomy, and foraminotomies, for decompression of the canal, lateral recess, and neural foraminal stenosis, with decompression of the exiting L4 and L5 nerve roots bilaterally, with decompression beyond that required for interbody arthrodesis; bilateral L4-5 lumbar stabilization with bilateral L4-5 posterior lumbar interbody arthrodesis with AVS peek interbody implants, Vitoss BA with bone marrow aspirate, and infuse and bilateral L4-5 posterior lateral arthrodesis with nonsegmental radius posterior instrumentation, Vitoss BA with bone marrow aspirate, and infuse  SURGEON:  Surgeon(s): Shirlean Kelly, MD  ANESTHESIA:   general  EBL:  Total I/O In: 1000 [I.V.:1000] Out: 400 [Urine:200; Blood:200]  BLOOD ADMINISTERED:none  CELL SAVER GIVEN: Cell Saver technician felt that there was insufficient blood loss or process to collect the blood  COUNT: Correct per nursing staff  DICTATION: Patient is brought to the operating room placed under general endotracheal anesthesia. The patient was turned to prone position the lumbar region was prepped with isopropyl alcohol was allowed to fully evaporate and then DuraPrep, that was allowed to fully dry. The field was draped in a sterile fashion. The midline was infiltrated with local anesthesia with epinephrine. A localizing x-ray was taken and then a midline incision was made  carried down through the subcutaneous tissue, bipolar cautery and electrocautery were used to maintain hemostasis. Dissection was carried down to the lumbar fascia. The fascia was incised bilaterally and the paraspinal muscles were dissected with a spinous process and lamina in a subperiosteal fashion. Another x-ray was taken for localization and the L4-5 level was localized. Dissection was then carried out laterally over the facet complex and the transverse processes of L4 and L5 were exposed and decorticated.   We then proceeded with the decompression. Bilateral L4 and L5 laminotomies were performed using the high-speed drill and Kerrison punches. Dissection was carried out laterally including facetectomy and foraminotomies with decompression of the stenotic compression of the exiting L4 and L5 nerve roots. The most severe stenosis was found in the right L4-5 foramen, compressing the exiting right L4 nerve root. This was fully decompressed. Once the decompression stenotic compression of the thecal sac and exiting nerve roots was completed we proceeded with the posterior lumbar interbody arthrodesis. The annulus was incised bilaterally and the disc space entered. A thorough discectomy was performed using pituitary rongeurs and curettes. Once the discectomy was completed we began to prepare the endplate surfaces removing the cartilaginous endplates surface. We then measured the height of the intervertebral disc space. We selected 9 x 20 x 4 AVS peek interbody implants.  The C-arm fluoroscope was then draped and brought in the field and we identified the pedicle entry points bilaterally at the L4 and L5 levels. Each of the 4 pedicles was probed, we aspirated bone marrow aspirate from the vertebral bodies, this was injected over a 10 cc and a 5 cc strip of Vitoss BA. Then each of the pedicles was examined with the ball probe good bony surfaces were found and no bony cuts were found. Each of the pedicles was then  tapped with a 5.25 mm tap, again examined with the ball  probe good threading was found and no bony cuts were found. We then placed 5.75 by 40 millimeter screws bilaterally at each level.  We then packed the AVS peek interbody implants with Vitoss BA with bone marrow aspirate and infuse, and then placed the first implant and on the right side, carefully retracting the thecal sac and nerve root medially. We then went back to the left side and packed the midline with additional Vitoss BA with bone marrow aspirate and infuse, and then placed a second implant on the left side again retracting the thecal sac and nerve root medially. Additional Vitoss BA with bone marrow aspirate and infuse was packed lateral to the implants.  We then packed the lateral gutter over the transverse processes and intertransverse space with Vitoss BA with bone marrow aspirate and infuse. We then selected a 30 mm pre-lordosed rods, they were placed within the screw heads and secured with locking caps once all 4 locking caps were placed final tightening was performed against a counter torque.  The wound had been irrigated multiple times during the procedure with saline solution and bacitracin solution, good hemostasis was established with a combination of bipolar cautery and Gelfoam with thrombin. Once good hemostasis was confirmed we proceeded with closure paraspinal muscles deep fascia and Scarpa's fascia were closed with interrupted undyed 1 Vicryl sutures the subcutaneous and subcuticular closed with interrupted inverted 2-0 undyed Vicryl sutures the skin edges were approximated with Dermabond. There was dressed with sterile gauze and Hypafix.  Following surgery the patient was turned back to the supine position to be reversed and the anesthetic extubated and transferred to the recovery room for further care.   PLAN OF CARE: Admit to inpatient   PATIENT DISPOSITION:  PACU - hemodynamically stable.   Delay start of  Pharmacological VTE agent (>24hrs) due to surgical blood loss or risk of bleeding:  yes

## 2016-12-07 NOTE — Progress Notes (Signed)
Vitals:   12/07/16 1315 12/07/16 1330 12/07/16 1351 12/07/16 1629  BP: 97/63 (!) 98/56 101/69 121/80  Pulse: 78 76 74 79  Resp: 19 17 16 16   Temp: 97.8 F (36.6 C) 97.8 F (36.6 C) 97.8 F (36.6 C) 98.4 F (36.9 C)  TempSrc:      SpO2: 95% 96% 98% 100%  Weight:        Patient resting in bed. She has been up and ambulating in the halls, with the staff. Dressing clean and dry. Foley DC'd after ambulating, nursing staff monitoring voiding function. Good relief of right lumbar radicular pain. Doing well following surgery.  Plan: Encouraged to ambulate. Continue to progress through postoperative recovery.  Hewitt ShortsNUDELMAN,ROBERT W, MD 12/07/2016, 5:38 PM

## 2016-12-07 NOTE — Anesthesia Postprocedure Evaluation (Signed)
Anesthesia Post Note  Patient: Freddrick Marcholly T Janosik  Procedure(s) Performed: Procedure(s) (LRB): LUMBAR 4- LUMBAR 5 DECOMPRESSION, POSTERIOR LUMBAR INTERBODY FUSION, POSTERIOR LATERAL ARTHRODESIS (N/A)     Patient location during evaluation: PACU Anesthesia Type: General Level of consciousness: awake, awake and alert and oriented Pain management: pain level controlled Vital Signs Assessment: post-procedure vital signs reviewed and stable Respiratory status: spontaneous breathing, nonlabored ventilation and respiratory function stable Cardiovascular status: blood pressure returned to baseline Postop Assessment: no headache Anesthetic complications: no    Last Vitals:  Vitals:   12/07/16 1351 12/07/16 1629  BP: 101/69 121/80  Pulse: 74 79  Resp: 16 16  Temp: 36.6 C 36.9 C    Last Pain:  Vitals:   12/07/16 1315  TempSrc:   PainSc: 2                  Anjeanette Petzold COKER

## 2016-12-07 NOTE — Anesthesia Procedure Notes (Addendum)
Procedure Name: Intubation Date/Time: 12/07/2016 8:43 AM Performed by: Melina Copa, Manjot Beumer R Pre-anesthesia Checklist: Patient identified, Emergency Drugs available, Suction available and Patient being monitored Patient Re-evaluated:Patient Re-evaluated prior to inductionOxygen Delivery Method: Circle System Utilized Preoxygenation: Pre-oxygenation with 100% oxygen Intubation Type: IV induction Ventilation: Mask ventilation without difficulty Laryngoscope Size: Mac and 3 Grade View: Grade III Tube type: Oral Tube size: 7.5 mm Number of attempts: 3 (ETT placed in esophagus X 2 and immediately recognized and removed. Bougie placed and ETT advanced over it through vocal cords.) Airway Equipment and Method: Stylet,  Oral airway and Bougie stylet Placement Confirmation: ETT inserted through vocal cords under direct vision,  positive ETCO2 and breath sounds checked- equal and bilateral Secured at: 22 cm Tube secured with: Tape Dental Injury: Teeth and Oropharynx as per pre-operative assessment

## 2016-12-07 NOTE — H&P (Signed)
Subjective: Patient is a 66 y.o. right handed white female who is admitted for treatment of L4-5 multifactorial lumbar stenosis contributed to by a dynamic grade 2 spondylolisthesis of L4 on L5, hypertrophic facet arthropathy, and broad-based disc herniation. And medically she's been having difficulties with disabling right lumbar radicular pain extending through the anterior right knee and leg, aggravated by standing and walking. Patient has not responded to NSAIDs or spinal injections. She is admitted now for an L4-5 lumbar decompression including laminectomy, facetectomy, and foraminotomy, and a bilateral L4-5 arthrodesis including posterior lumbar interbody arthrodesis with interbody implants and bone graft and a posterior lateral arthrodesis with posterior instrumentation and bone graft.    Patient Active Problem List   Diagnosis Date Noted  . MORTON'S NEUROMA, RIGHT 06/08/2009  . STRESS FRACTURE OF THE METATARSALS 03/26/2009  . KNEE PAIN, RIGHT 01/06/2009  . PES PLANUS 01/06/2009   Past Medical History:  Diagnosis Date  . Arthritis    arthritis- OA- hands, wrist, (stiff in the a.m. )   . Complication of anesthesia    4 top front crowns, allergy to Fentanyl  . Dental injury    four frontal teeth with braces and then each capped indivdualy  . History of rib fracture   . Neuromuscular disorder (HCC)    h/o plantar fascitis   . Plantar fasciitis    left  . Shingles     Past Surgical History:  Procedure Laterality Date  . CESAREAN SECTION    . FOOT SURGERY Left   . PERINEOPLASTY    . VAGINAL DELIVERY      Prescriptions Prior to Admission  Medication Sig Dispense Refill Last Dose  . acetaminophen (TYLENOL) 500 MG tablet Take 1,000 mg by mouth 2 (two) times daily as needed for mild pain.   12/06/2016 at Unknown time  . aspirin EC 81 MG tablet Take 81 mg by mouth daily.   11/30/2016  . Cyanocobalamin (VITAMIN B-12 PO) Take 1 tablet by mouth daily.    11/30/2016  .  drospirenone-estradiol (ANGELIQ) 0.5-1 MG per tablet Take 1 tablet by mouth daily.   12/07/2016 at 0500  . traMADol (ULTRAM) 50 MG tablet Take 50 mg by mouth 2 (two) times daily as needed for moderate pain.   Past Week at Unknown time  . vitamin C (ASCORBIC ACID) 500 MG tablet Take 500 mg by mouth daily.   11/30/2016  . vitamin E 400 UNIT capsule Take 200 Units by mouth daily.    11/30/2016   Allergies  Allergen Reactions  . Betadine [Povidone Iodine] Rash    White bumps, pt had a cut on finger and put betadine on her finger and her finger broke out with tons of bumps   . Fentanyl Itching  . Hydrocodone Itching and Nausea Only  . Percocet [Oxycodone-Acetaminophen] Itching and Nausea Only  . Scallops [Shellfish Allergy] Nausea And Vomiting    OK with all other shellfish    Social History  Substance Use Topics  . Smoking status: Former Smoker    Quit date: 06/27/1972  . Smokeless tobacco: Never Used  . Alcohol use 0.6 oz/week    1 Glasses of wine per week     Comment: occ0- holidays    Family History  Problem Relation Age of Onset  . Colon cancer Neg Hx      Review of Systems A comprehensive review of systems was negative.  Objective: Vital signs in last 24 hours: Temp:  [97.3 F (36.3 C)] 97.3 F (36.3 C) (  06/13 0654) Pulse Rate:  [78] 78 (06/13 0654) Resp:  [20] 20 (06/13 0654) BP: (133)/(77) 133/77 (06/13 0654) SpO2:  [100 %] 100 % (06/13 0654) Weight:  [64.9 kg (143 lb)] 64.9 kg (143 lb) (06/13 0654)  EXAM: Patient well-developed well-nourished white female in no acute distress. Lungs are clear to auscultation , the patient has symmetrical respiratory excursion. Heart has a regular rate and rhythm normal S1 and S2 no murmur.   Abdomen is soft nontender nondistended bowel sounds are present. Extremity examination shows no clubbing cyanosis or edema. Motor examination shows 5 over 5 strength in the lower extremities including the iliopsoas quadriceps dorsiflexor extensor hallicus   longus and plantar flexor bilaterally. Sensation is intact to pinprick in the distal lower extremities. Reflexes are symmetrical bilaterally. No pathologic reflexes are present. Patient has a normal gait and stance.   Data Review:CBC    Component Value Date/Time   WBC 6.5 12/01/2016 1639   RBC 4.60 12/01/2016 1639   HGB 14.1 12/01/2016 1639   HCT 43.2 12/01/2016 1639   PLT 252 12/01/2016 1639   MCV 93.9 12/01/2016 1639   MCH 30.7 12/01/2016 1639   MCHC 32.6 12/01/2016 1639   RDW 13.0 12/01/2016 1639                          BMET    Component Value Date/Time   NA 138 12/01/2016 1639   K 4.3 12/01/2016 1639   CL 105 12/01/2016 1639   CO2 25 12/01/2016 1639   GLUCOSE 96 12/01/2016 1639   BUN 15 12/01/2016 1639   CREATININE 0.78 12/01/2016 1639   CALCIUM 9.2 12/01/2016 1639   GFRNONAA >60 12/01/2016 1639   GFRAA >60 12/01/2016 1639     Assessment/Plan: Patient with advanced generation at the L4-5 level with disabling right lumbar radiculopathy who is admitted now for an L4-5 lumbar decompression and arthrodesis.  I've discussed with the patient the nature of his condition, the nature the surgical procedure, the typical length of surgery, hospital stay, and overall recuperation, the limitations postoperatively, and risks of surgery. I discussed risks including risks of infection, bleeding, possibly need for transfusion, the risk of nerve root dysfunction with pain, weakness, numbness, or paresthesias, the risk of dural tear and CSF leakage and possible need for further surgery, the risk of failure of the arthrodesis and possibly for further surgery, the risk of anesthetic complications including myocardial infarction, stroke, pneumonia, and death. We discussed the need for postoperative immobilization in a lumbar brace. Understanding all this the patient does wish to proceed with surgery and is admitted for such.       Hewitt Shorts, MD 12/07/2016 7:30 AM

## 2016-12-07 NOTE — Anesthesia Preprocedure Evaluation (Signed)
Anesthesia Evaluation  Patient identified by MRN, date of birth, ID band Patient awake    Reviewed: Allergy & Precautions, NPO status , Patient's Chart, lab work & pertinent test results  Airway Mallampati: II  TM Distance: >3 FB Neck ROM: Full    Dental  (+) Teeth Intact, Dental Advisory Given   Pulmonary former smoker,    breath sounds clear to auscultation       Cardiovascular  Rhythm:Regular Rate:Normal     Neuro/Psych    GI/Hepatic   Endo/Other    Renal/GU      Musculoskeletal   Abdominal   Peds  Hematology   Anesthesia Other Findings   Reproductive/Obstetrics                             Anesthesia Physical Anesthesia Plan  ASA: II  Anesthesia Plan: General   Post-op Pain Management:    Induction: Intravenous  PONV Risk Score and Plan: 1 and Ondansetron and Dexamethasone  Airway Management Planned: Oral ETT  Additional Equipment:   Intra-op Plan:   Post-operative Plan: Extubation in OR  Informed Consent: I have reviewed the patients History and Physical, chart, labs and discussed the procedure including the risks, benefits and alternatives for the proposed anesthesia with the patient or authorized representative who has indicated his/her understanding and acceptance.     Dental advisory given  Plan Discussed with: CRNA and Anesthesiologist  Anesthesia Plan Comments:         Anesthesia Quick Evaluation  

## 2016-12-07 NOTE — Transfer of Care (Signed)
Immediate Anesthesia Transfer of Care Note  Patient: Felicia Peters  Procedure(s) Performed: Procedure(s) with comments: LUMBAR 4- LUMBAR 5 DECOMPRESSION, POSTERIOR LUMBAR INTERBODY FUSION, POSTERIOR LATERAL ARTHRODESIS (N/A) - LUMBAR 4- LUMBAR 5 DECOMPRESSION, POSTERIOR LUMBAR INTERBODY FUSION, POSTERIOR LATERAL ARTHRODESIS  Patient Location: PACU  Anesthesia Type:General  Level of Consciousness: awake, oriented and patient cooperative  Airway & Oxygen Therapy: Patient Spontanous Breathing and Patient connected to nasal cannula oxygen  Post-op Assessment: Report given to RN, Post -op Vital signs reviewed and stable and Patient moving all extremities  Post vital signs: Reviewed and stable  Last Vitals:  Vitals:   12/07/16 0654  BP: 133/77  Pulse: 78  Resp: 20  Temp: 36.3 C    Last Pain:  Vitals:   12/07/16 0733  TempSrc:   PainSc: 5          Complications: No apparent anesthesia complications

## 2016-12-08 DIAGNOSIS — M48062 Spinal stenosis, lumbar region with neurogenic claudication: Secondary | ICD-10-CM | POA: Diagnosis not present

## 2016-12-08 MED ORDER — HYDROCODONE-ACETAMINOPHEN 5-325 MG PO TABS: 1.0000 | ORAL_TABLET | ORAL | 0 refills | Status: AC | PRN

## 2016-12-08 NOTE — Progress Notes (Signed)
Patient alert and oriented, mae's well, voiding adequate amount of urine, swallowing without difficulty, no c/o pain at time of discharge. Patient discharged home with family. Script and discharged instructions given to patient. Patient and family stated understanding of instructions given. Patient has an appointment with Dr. Nudelman 

## 2016-12-08 NOTE — Discharge Summary (Signed)
Physician Discharge Summary  Patient ID: Felicia Peters MRN: [id number] DOB/AGE: 66/12/1950 66 y.o.  Admit date: 12/07/2016 Discharge date: 12/08/2016  Admission Diagnoses:  Multifactorial L4-5 lumbar stenosis with neurogenic claudication, L4-5 grade 2 dynamic degenerative spondylolisthesis, lumbar spondylosis, lumbar degenerative disease, lumbar radiculopathy   Discharge Diagnoses:  Multifactorial L4-5 lumbar stenosis with neurogenic claudication, L4-5 grade 2 dynamic degenerative spondylolisthesis, lumbar spondylosis, lumbar degenerative disease, lumbar radiculopathy  Active Problems:   Lumbar stenosis   Discharged Condition: good  Hospital Course: Was admitted, underwent L4-5 lumbar decompression and arthrodesis. Postoperatively she has done well. She is up and ambulating actively in the halls. Her dressing was removed, and the incision is healing nicely. It is no erythema, swelling, or drainage. She is voiding well. She is only been using Toradol and Tylenol for pain. However we are discharging her with a prescription for hydrocodone to be used as needed. We have advised that she also use Tylenol, and Advil or Aleve as needed. She's been given instructions regarding wound care and activities following discharge. She is scheduled follow-up with me in about 3 weeks.  Discharge Exam: Blood pressure 116/79, pulse 76, temperature 98 F (36.7 C), temperature source Oral, resp. rate 18, weight 64.9 kg (143 lb), SpO2 100 %.  Disposition:  Home  Discharge Instructions    Discharge wound care:    Complete by:  As directed    Leave the wound open to air. Shower daily with the wound uncovered. Water and soapy water should run over the incision area. Do not wash directly on the incision for 2 weeks. Remove the glue after 2 weeks.   Driving Restrictions    Complete by:  As directed    No driving for 2 weeks. May ride in the car locally now. May begin to drive locally in 2 weeks.   Other Restrictions     Complete by:  As directed    Walk gradually increasing distances out in the fresh air at least twice a day. Walking additional 6 times inside the house, gradually increasing distances, daily. No bending, lifting, or twisting. Perform activities between shoulder and waist height (that is at counter height when standing or table height when sitting).     Allergies as of 12/08/2016      Reactions   Betadine [povidone Iodine] Rash   White bumps, pt had a cut on finger and put betadine on her finger and her finger broke out with tons of bumps    Fentanyl Itching   Hydrocodone Itching, Nausea Only   Percocet [oxycodone-acetaminophen] Itching, Nausea Only   Scallops [shellfish Allergy] Nausea And Vomiting   OK with all other shellfish      Medication List    TAKE these medications   acetaminophen 500 MG tablet Commonly known as:  TYLENOL Take 1,000 mg by mouth 2 (two) times daily as needed for mild pain.   aspirin EC 81 MG tablet Take 81 mg by mouth daily.   drospirenone-estradiol 0.5-1 MG tablet Commonly known as:  ANGELIQ Take 1 tablet by mouth daily.   HYDROcodone-acetaminophen 5-325 MG tablet Commonly known as:  NORCO/VICODIN Take 1-2 tablets by mouth every 4 (four) hours as needed (pain).   traMADol 50 MG tablet Commonly known as:  ULTRAM Take 50 mg by mouth 2 (two) times daily as needed for moderate pain.   VITAMIN B-12 PO Take 1 tablet by mouth daily.   vitamin C 500 MG tablet Commonly known as:  ASCORBIC ACID Take 500 mg by  mouth daily.   vitamin E 400 UNIT capsule Take 200 Units by mouth daily.        SignedHewitt Shorts 12/08/2016, 10:55 AM

## 2016-12-08 NOTE — Discharge Instructions (Signed)

## 2018-06-13 ENCOUNTER — Ambulatory Visit (INDEPENDENT_AMBULATORY_CARE_PROVIDER_SITE_OTHER): Payer: Medicare Other | Admitting: Psychology

## 2018-06-13 DIAGNOSIS — F411 Generalized anxiety disorder: Secondary | ICD-10-CM

## 2018-06-13 DIAGNOSIS — F331 Major depressive disorder, recurrent, moderate: Secondary | ICD-10-CM | POA: Diagnosis not present

## 2018-06-22 ENCOUNTER — Ambulatory Visit (INDEPENDENT_AMBULATORY_CARE_PROVIDER_SITE_OTHER): Payer: Medicare Other | Admitting: Psychology

## 2018-06-22 DIAGNOSIS — F331 Major depressive disorder, recurrent, moderate: Secondary | ICD-10-CM | POA: Diagnosis not present

## 2018-06-22 DIAGNOSIS — F411 Generalized anxiety disorder: Secondary | ICD-10-CM

## 2018-07-11 ENCOUNTER — Ambulatory Visit (INDEPENDENT_AMBULATORY_CARE_PROVIDER_SITE_OTHER): Payer: Medicare Other | Admitting: Psychology

## 2018-07-11 DIAGNOSIS — F411 Generalized anxiety disorder: Secondary | ICD-10-CM | POA: Diagnosis not present

## 2018-07-11 DIAGNOSIS — F331 Major depressive disorder, recurrent, moderate: Secondary | ICD-10-CM | POA: Diagnosis not present

## 2018-08-14 ENCOUNTER — Ambulatory Visit (INDEPENDENT_AMBULATORY_CARE_PROVIDER_SITE_OTHER): Payer: Medicare Other | Admitting: Psychology

## 2018-08-14 DIAGNOSIS — F331 Major depressive disorder, recurrent, moderate: Secondary | ICD-10-CM

## 2018-08-14 DIAGNOSIS — F411 Generalized anxiety disorder: Secondary | ICD-10-CM

## 2018-12-10 IMAGING — RF DG C-ARM 61-120 MIN
1 series · 2 of 2 positions shown · non-contrast
Comparison: Radiographs 07/22/2016.

CLINICAL DATA: Lumbar decompression and PLIF at L4-5.

EXAM:
LUMBAR SPINE - 2-3 VIEW; DG C-ARM 61-120 MIN

[Series 1: run · 2 of 2 slices shown]
[im 1/2]
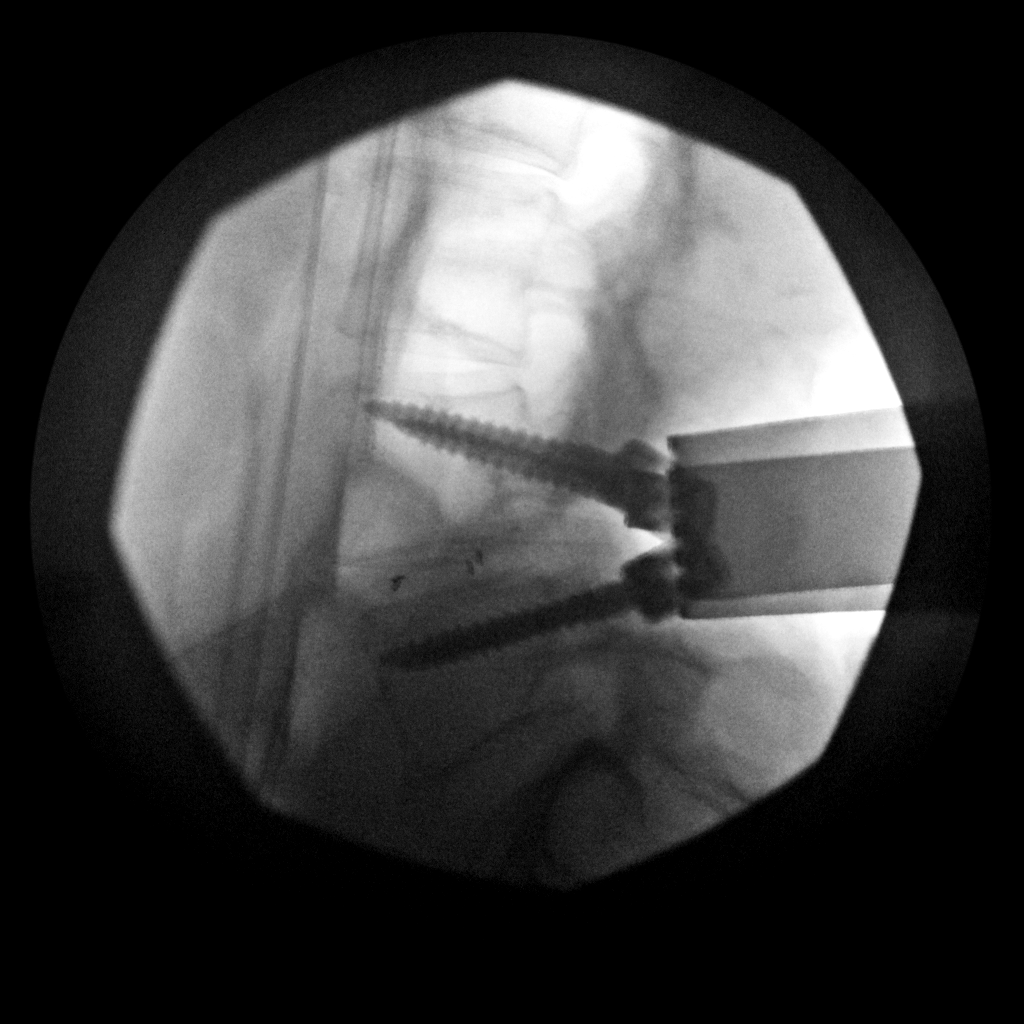
[im 2/2]
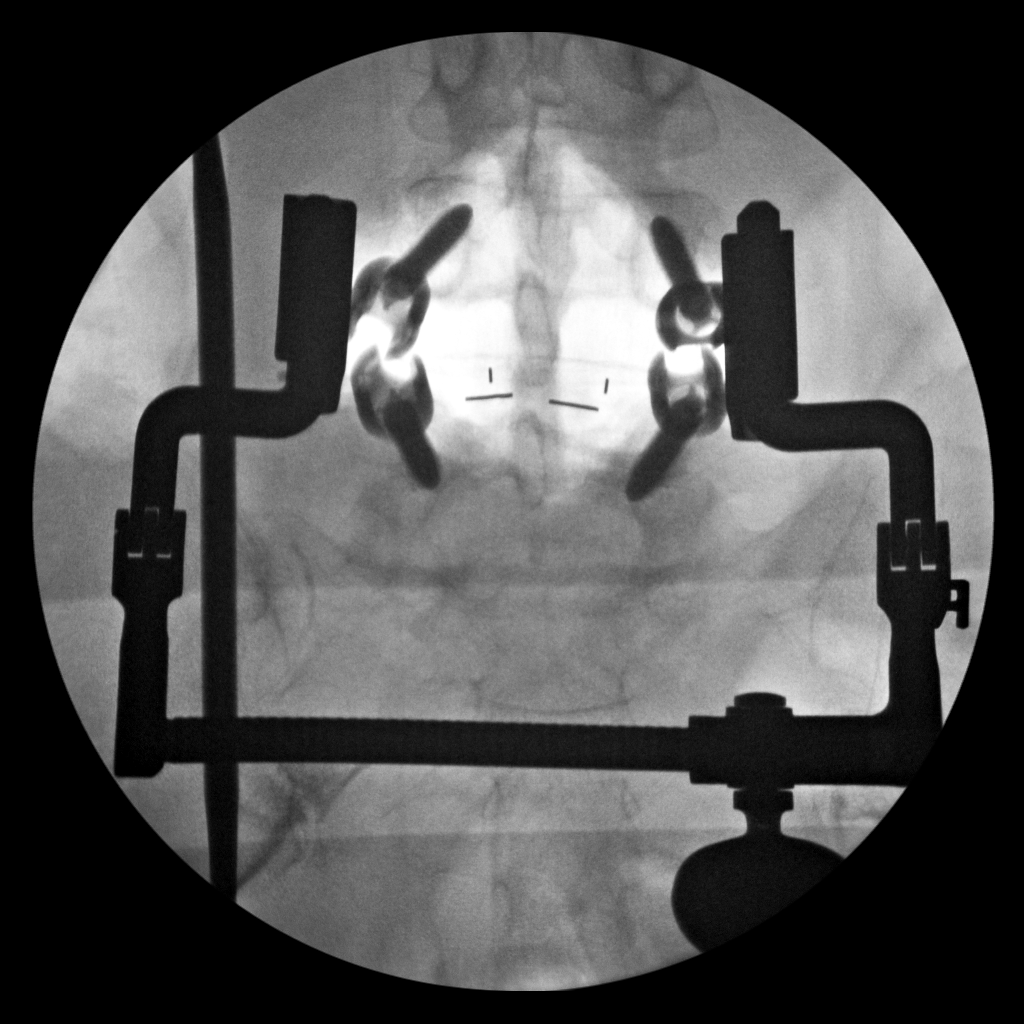

[2 of 2 positions shown; findings below may reference images not displayed]

Intraoperative radiographs
earlier the same date.

FLUOROSCOPY TIME:  0 minutes and 59 seconds.

C-arm fluoroscopic images were obtained intraoperatively and
submitted for post operative interpretation. Please see the
performing provider's procedural report for the fluoroscopy time
utilized.
FINDINGS: Spot PA and lateral fluoroscopic images of the lower lumbar spine
demonstrate laminectomy defects at L4-5. There are bilateral pedicle
screws at the L4 and L5 levels. The interconnecting rods have not
yet been placed. Interbody spacer is well positioned.
Anterolisthesis at the operative level appears improved.
IMPRESSION: Intraoperative views during L4-5 PLIF. No demonstrated complication.

## 2019-04-18 ENCOUNTER — Other Ambulatory Visit: Payer: Self-pay

## 2019-04-18 DIAGNOSIS — Z20822 Contact with and (suspected) exposure to covid-19: Secondary | ICD-10-CM

## 2019-04-20 LAB — NOVEL CORONAVIRUS, NAA: SARS-CoV-2, NAA: NOT DETECTED

## 2020-04-25 ENCOUNTER — Ambulatory Visit: Payer: Medicare PPO | Attending: Internal Medicine

## 2020-04-25 DIAGNOSIS — Z23 Encounter for immunization: Secondary | ICD-10-CM

## 2020-04-25 NOTE — Progress Notes (Signed)
   Covid-19 Vaccination [facility]  Name:  SHARMEKA PALMISANO    MRN: [id number] DOB: 1950/07/21  04/25/2020  Ms. Verret was observed post Covid-19 immunization for 15 minutes without incident. She was provided with Vaccine Information Sheet and instruction to access the V-Safe system.   Ms. Blackson was instructed to call 911 with any severe reactions post vaccine: Marland Kitchen Difficulty breathing  . Swelling of face and throat  . A fast heartbeat  . A bad rash all over body  . Dizziness and weakness
